# Patient Record
Sex: Female | Born: 1950 | Race: White | Hispanic: No | Marital: Single | Smoking: Never smoker
Health system: Southern US, Community
[De-identification: ages and names within clinical notes are randomized; demographics above are authoritative.]

## PROBLEM LIST (undated history)

## (undated) DIAGNOSIS — I10 Essential (primary) hypertension: Secondary | ICD-10-CM

## (undated) HISTORY — PX: ABDOMINAL HYSTERECTOMY: SHX81

---

## 2014-12-22 ENCOUNTER — Emergency Department (HOSPITAL_COMMUNITY)
Admission: EM | Admit: 2014-12-22 | Discharge: 2014-12-23 | Disposition: A | Discharge status: Home / Self Care | Payer: BLUE CROSS/BLUE SHIELD | Attending: Emergency Medicine | Admitting: Emergency Medicine

## 2014-12-22 DIAGNOSIS — Z79899 Other long term (current) drug therapy: Secondary | ICD-10-CM | POA: Diagnosis not present

## 2014-12-22 DIAGNOSIS — R0789 Other chest pain: Secondary | ICD-10-CM | POA: Insufficient documentation

## 2014-12-22 DIAGNOSIS — R079 Chest pain, unspecified: Secondary | ICD-10-CM | POA: Diagnosis present

## 2014-12-22 DIAGNOSIS — I10 Essential (primary) hypertension: Secondary | ICD-10-CM | POA: Insufficient documentation

## 2014-12-22 DIAGNOSIS — R002 Palpitations: Secondary | ICD-10-CM | POA: Insufficient documentation

## 2014-12-22 HISTORY — DX: Essential (primary) hypertension: I10

## 2014-12-23 ENCOUNTER — Encounter (HOSPITAL_COMMUNITY): Payer: Self-pay | Admitting: Emergency Medicine

## 2014-12-23 ENCOUNTER — Emergency Department (HOSPITAL_COMMUNITY): Payer: BLUE CROSS/BLUE SHIELD

## 2014-12-23 LAB — BASIC METABOLIC PANEL
Anion gap: 7 (ref 5–15)
BUN: 15 mg/dL (ref 6–23)
CO2: 28 mmol/L (ref 19–32)
CREATININE: 0.77 mg/dL (ref 0.50–1.10)
Calcium: 9 mg/dL (ref 8.4–10.5)
Chloride: 105 mmol/L (ref 96–112)
GFR calc Af Amer: >90 mL/min (ref >90)
GFR, EST NON AFRICAN AMERICAN: 87 mL/min — AB (ref >90)
GLUCOSE: 113 mg/dL — AB (ref 70–99)
Potassium: 3.8 mmol/L (ref 3.5–5.1)
SODIUM: 140 mmol/L (ref 135–145)

## 2014-12-23 LAB — CBC
HCT: 43.8 % (ref 36.0–46.0)
Hemoglobin: 14.2 g/dL (ref 12.0–15.0)
MCH: 27.8 pg (ref 26.0–34.0)
MCHC: 32.4 g/dL (ref 30.0–36.0)
MCV: 85.7 fL (ref 78.0–100.0)
Platelets: 263 10*3/uL (ref 150–400)
RBC: 5.11 MIL/uL (ref 3.87–5.11)
RDW: 13.8 % (ref 11.5–15.5)
WBC: 11.9 10*3/uL — ABNORMAL HIGH (ref 4.0–10.5)

## 2014-12-23 LAB — I-STAT TROPONIN, ED
TROPONIN I, POC: 0 ng/mL (ref 0.00–0.08)
Troponin i, poc: 0 ng/mL (ref 0.00–0.08)

## 2014-12-23 MED ORDER — NITROGLYCERIN 0.4 MG SL SUBL
0.4000 mg | SUBLINGUAL_TABLET | SUBLINGUAL | Status: DC | PRN
  Filled 2014-12-23: qty 1

## 2014-12-23 NOTE — ED Notes (Signed)
Dr. Harrison at the bedside. 

## 2014-12-23 NOTE — ED Notes (Signed)
Dr. Harrison in to see the patient.  

## 2014-12-23 NOTE — ED Notes (Signed)
Phlebotomy at the bedside  

## 2014-12-23 NOTE — ED Provider Notes (Signed)
CSN: [id number]     Arrival date & time 12/22/14  2358 History  This chart was scribed for Purvis Sheffield, MD by Bronson Curb, ED Scribe. This patient was seen in room B19C/B19C and the patient's care was started at 1:36 AM.     Chief Complaint  Patient presents with  . Chest Pain    Patient is a 64 y.o. female presenting with chest pain. The history is provided by the patient. No language interpreter was used.  Chest Pain Pain location:  L chest Pain quality: burning   Pain radiates to:  Upper back Pain radiates to the back: yes   Pain severity:  Moderate Onset quality:  Sudden Duration:  1 month Timing:  Intermittent Progression:  Worsening Chronicity:  New Relieved by:  Nothing Worsened by:  Nothing tried Ineffective treatments: Advil and aspirin. Associated symptoms: palpitations   Associated symptoms: no abdominal pain, no back pain, no cough, no dizziness, no fever, no headache, no nausea, no shortness of breath and not vomiting   Risk factors: hypertension      HPI Comments: Xianna Siverling is a 64 y.o. female, with history of HTN, brought in by ambulance, who presents to the Emergency Department complaining of intermittent brief "flashes" of burning sensation to the left chest that began over the last month. She reports radiation to the back (between the shoulder blades), this evening, that is now constant. She rates her discomfort a 5/10 at this time. Patient reports she took her blood pressure at home and it was 199/108 with a pulse of 120 and decided to come to the ED to be evaluated. She reports the pain is not reproducible or exacerbating with pressure and denies pain with deep breathing. Patient has taken 2 Advil and was given  aspirin without relief. She reports history of prior similar episodes or palpitations and elevated BP but denies chest pain/discomfort with these episodes. Patient has not been evaluated by a cardiologist for her symptoms. She reports her  mother died at 63 with a history of HTN and DM. Patient denies history of cancer, PE/DVT, GERD, or recent surgies. She further denies diaphoresis, SOB, nausea, vomiting, or leg swelling.   Past Medical History  Diagnosis Date  . Hypertension    Past Surgical History  Procedure Laterality Date  . Abdominal hysterectomy      2000   History reviewed. No pertinent family history. History  Substance Use Topics  . Smoking status: Never Smoker   . Smokeless tobacco: Not on file  . Alcohol Use: Yes     Comment: occasional wine   OB History    No data available     Review of Systems  Constitutional: Negative for fever and chills.  HENT: Negative for rhinorrhea and sore throat.   Eyes: Negative for visual disturbance.  Respiratory: Negative for cough and shortness of breath.   Cardiovascular: Positive for chest pain and palpitations. Negative for leg swelling.  Gastrointestinal: Negative for nausea, vomiting, abdominal pain and diarrhea.  Genitourinary: Negative for dysuria.  Musculoskeletal: Negative for back pain and neck pain.  Skin: Negative for rash.  Neurological: Negative for dizziness, light-headedness and headaches.  Hematological: Does not bruise/bleed easily.  Psychiatric/Behavioral: Negative for confusion.      Allergies  Review of patient's allergies indicates no known allergies.  Home Medications   Prior to Admission medications   Medication Sig Start Date End Date Taking? Authorizing Provider  amLODipine (NORVASC) 5 MG tablet Take 5 mg by mouth daily.  10/22/14  Yes Historical Provider, MD  atorvastatin (LIPITOR) 20 MG tablet Take 20 mg by mouth daily at 6 PM.  10/24/14  Yes Historical Provider, MD  lisinopril (PRINIVIL,ZESTRIL) 20 MG tablet Take 20 mg by mouth daily.  12/11/14  Yes Historical Provider, MD   Triage Vitals: BP 136/77 mmHg  Pulse 96  Temp(Src) 98.2 F (36.8 C) (Oral)  Resp 18  Ht 5\' 1"  (1.549 m)  Wt 178 lb 1 oz (80.769 kg)  BMI 33.66 kg/m2   SpO2 97%  Physical Exam  Constitutional: She is oriented to person, place, and time. She appears well-developed and well-nourished. No distress.  HENT:  Head: Normocephalic and atraumatic.  Right Ear: Hearing normal.  Left Ear: Hearing normal.  Nose: Nose normal.  Mouth/Throat: Oropharynx is clear and moist and mucous membranes are normal.  Eyes: Conjunctivae and EOM are normal. Pupils are equal, round, and reactive to light.  Neck: Normal range of motion. Neck supple.  Cardiovascular: Normal rate, regular rhythm, S1 normal, S2 normal, normal heart sounds and intact distal pulses.  Exam reveals no gallop and no friction rub.   No murmur heard. Pulmonary/Chest: Effort normal and breath sounds normal. No respiratory distress. She exhibits no tenderness.  Abdominal: Soft. Normal appearance and bowel sounds are normal. There is no hepatosplenomegaly. There is no tenderness. There is no rebound, no guarding, no tenderness at McBurney's point and negative Murphy's sign. No hernia.  Musculoskeletal: Normal range of motion.  Normal strength and sensation in all extremities. 2+ and equal pulses in distal extremities BP in LUE 137/72 and RUE is 141/76 Pain seems to be improved with palpation of the left scapular area.  Neurological: She is alert and oriented to person, place, and time. She has normal strength. No cranial nerve deficit or sensory deficit. Coordination normal. GCS eye subscore is 4. GCS verbal subscore is 5. GCS motor subscore is 6.  Skin: Skin is warm, dry and intact. No rash noted. No cyanosis.  Psychiatric: She has a normal mood and affect. Her speech is normal and behavior is normal. Thought content normal.  Nursing note and vitals reviewed.   ED Course  Procedures (including critical care time)  DIAGNOSTIC STUDIES: Oxygen Saturation is 97% on room air, adequate by my interpretation.    COORDINATION OF CARE: At 0147 Discussed treatment plan with patient which includes  repeat troponin in 3 hours and repeat CXR. Patient agrees.   Labs Review Labs Reviewed  CBC - Abnormal; Notable for the following:    WBC 11.9 (*)    All other components within normal limits  BASIC METABOLIC PANEL - Abnormal; Notable for the following:    Glucose, Bld 113 (*)    GFR calc non Af Amer 87 (*)    All other components within normal limits  I-STAT TROPOININ, ED    Imaging Review Dg Chest 2 View  12/23/2014   CLINICAL DATA:  Acute onset of left-sided chest pain and burning. Initial encounter.  EXAM: CHEST  2 VIEW  COMPARISON:  Chest radiograph performed 12/23/2014  FINDINGS: The lungs are mildly hypoexpanded. Mild left basilar airspace opacity likely reflects atelectasis. There is no evidence of pleural effusion or pneumothorax.  The heart is borderline enlarged. No acute osseous abnormalities are seen.  IMPRESSION: Lungs mildly hypoexpanded. Mild left basilar airspace opacity likely reflects atelectasis. Borderline cardiomegaly noted.   Electronically Signed   By: Roanna RaiderJeffery  Chang M.D.   On: 12/23/2014 02:24   Dg Chest Port 1 View  12/23/2014  CLINICAL DATA:  Chest pain. Burning in left upper chest between shoulder blades, started this evening.  EXAM: PORTABLE CHEST - 1 VIEW  COMPARISON:  None.  FINDINGS: Lung volumes are low. Mild elevation of right hemidiaphragm. The heart size is normal for technique, mild tortuosity of the thoracic aorta. Mild bibasilar atelectasis. Minimal blunting of left costophrenic angle, may reflect small effusion. Pulmonary vasculature is normal. No consolidation or pneumothorax. No acute osseous abnormalities are seen.  IMPRESSION: Minimal blunting of left costophrenic angle, may reflect small effusion. Bibasilar atelectasis. Dedicated PA and lateral views could be considered when patient is able.   Electronically Signed   By: Rubye Oaks M.D.   On: 12/23/2014 00:31     EKG Interpretation   Date/Time:  Wednesday December 23 2014 00:01:49  EDT Ventricular Rate:  96 PR Interval:  176 QRS Duration: 74 QT Interval:  370 QTC Calculation: 467 R Axis:   62 Text Interpretation:  Normal sinus rhythm Low voltage QRS Borderline ECG  No old tracing to compare Confirmed by [facility]  MD, TREY ([id number]) on  12/23/2014 12:05:24 AM      MDM   Final diagnoses:  Chest pain    6:16 AM 64 y.o. female w hx of HTN here with atypical chest pain. She has had similar symptoms previously. She is afebrile and vital signs are unremarkable here.  I interpreted/reviewed the labs and/or imaging which were non-contributory.  Delta trop neg. Pt continues to appear well. Few RF's for CAD. Doubt ACS/PE.  I have discussed the diagnosis/risks/treatment options with the patient and believe the pt to be eligible for discharge home to follow-up with her pcp. We also discussed returning to the ED immediately if new or worsening sx occur. We discussed the sx which are most concerning (e.g., worsening pain, sob) that necessitate immediate return. Medications administered to the patient during their visit and any new prescriptions provided to the patient are listed below.  Medications given during this visit Medications - No data to display  Discharge Medication List as of 12/23/2014  4:50 AM        I personally performed the services described in this documentation, which was scribed in my presence. The recorded information has been reviewed and is accurate.    Purvis Sheffield, MD 12/24/14 [phone]

## 2014-12-23 NOTE — Discharge Instructions (Signed)

## 2014-12-23 NOTE — ED Notes (Signed)
Per EMS, the patient is from home with complaints of increasing chest pain. Pain radiates to left shoulder and back. No nausea, vomiting, or shortness of breath. 324mg  of aspirin given with EMS. BP 142/56, p 101, 18 RR, o2 sat 96% on room air.

## 2016-10-07 IMAGING — CR DG CHEST 2V
2 series · 2 of 2 positions shown · non-contrast
Comparison: Chest radiograph performed 12/23/2014

CLINICAL DATA: Acute onset of left-sided chest pain and burning.
Initial encounter.

EXAM:
CHEST  2 VIEW

[chest pa]
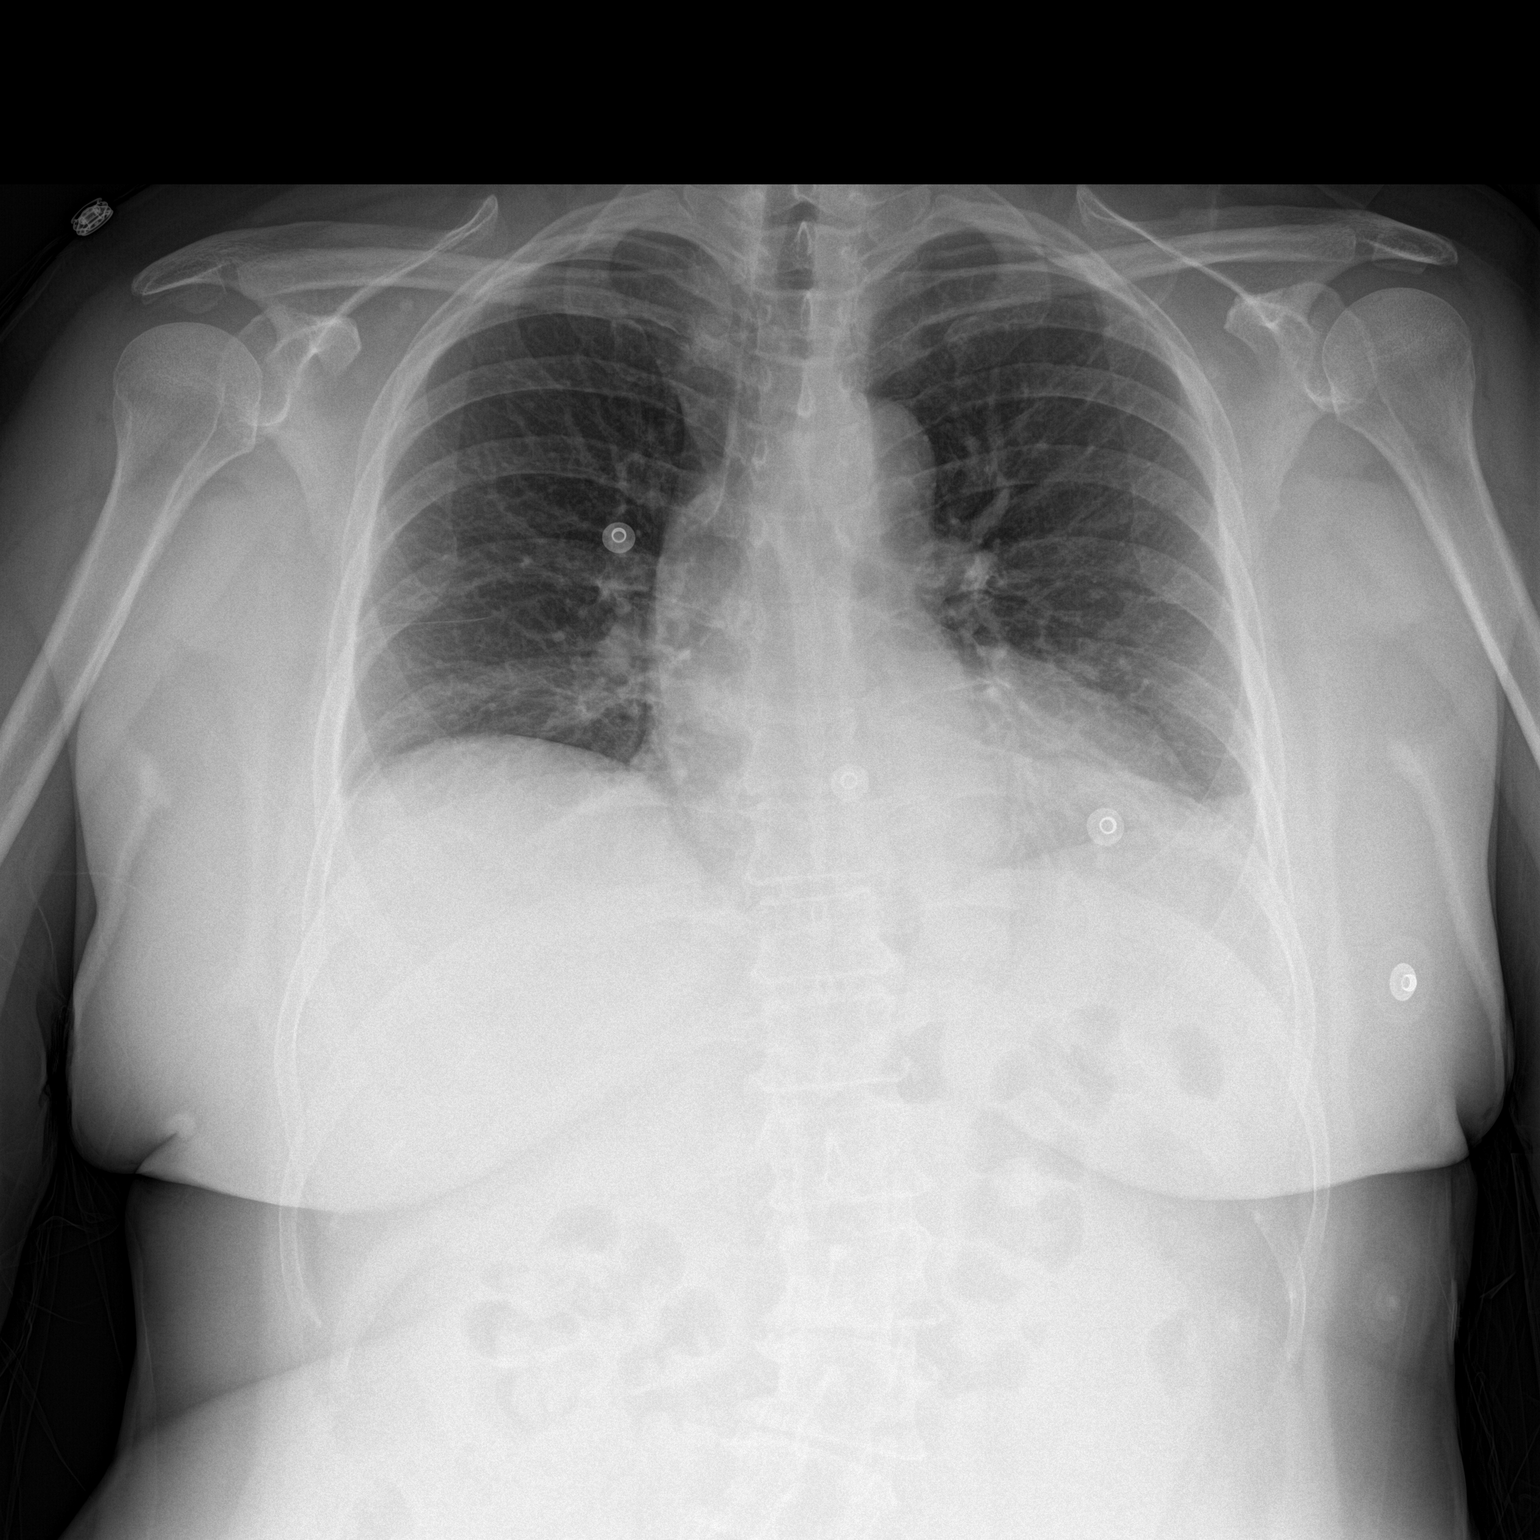

[chest lat]
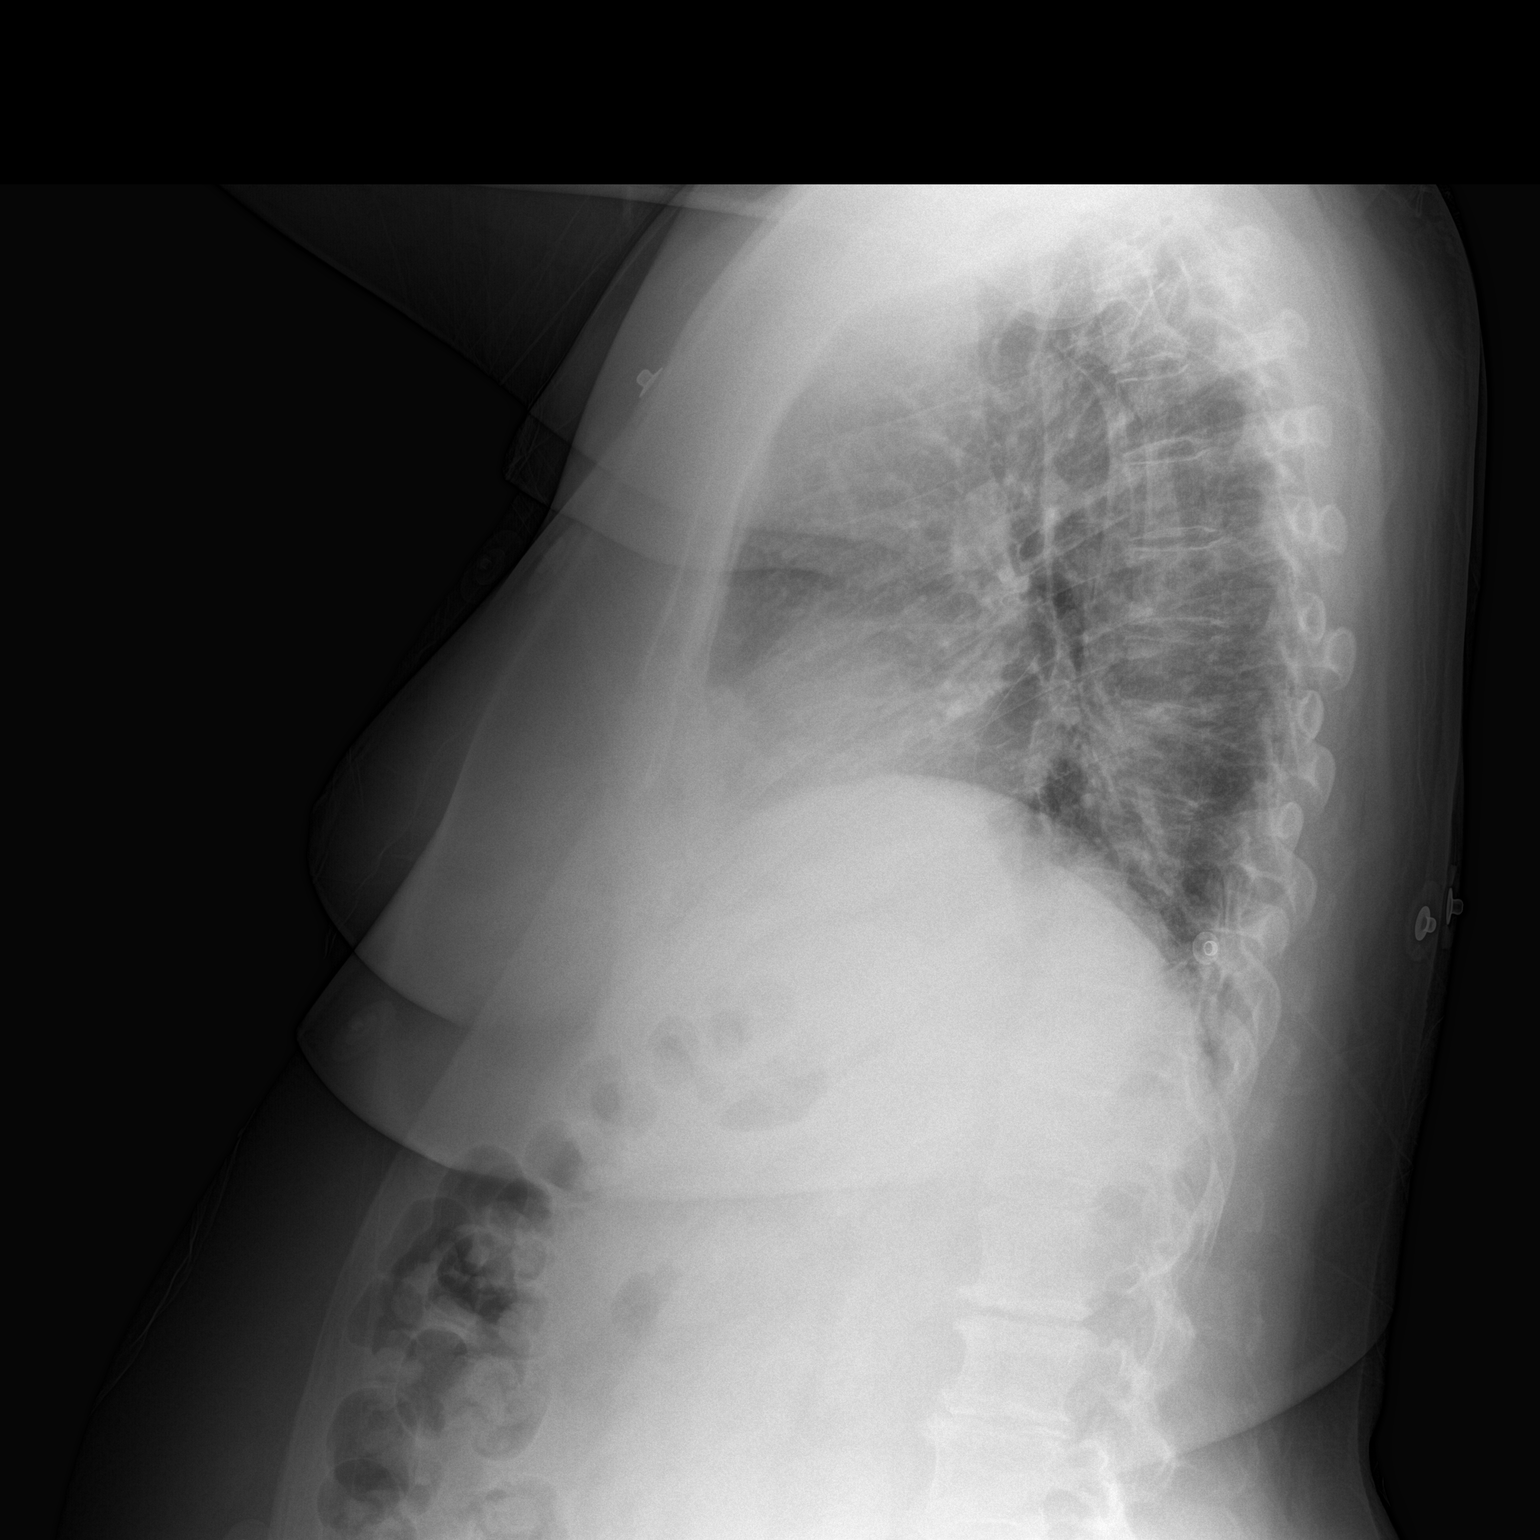

[2 of 2 positions shown; findings below may reference images not displayed]

FINDINGS: The lungs are mildly hypoexpanded. Mild left basilar airspace
opacity likely reflects atelectasis. There is no evidence of pleural
effusion or pneumothorax.

The heart is borderline enlarged. No acute osseous abnormalities are
seen.
IMPRESSION: Lungs mildly hypoexpanded. Mild left basilar airspace opacity likely
reflects atelectasis. Borderline cardiomegaly noted.
